# Patient Record
Sex: Female | Born: 2005
Health system: Southern US, Community
[De-identification: ages and names within clinical notes are randomized; demographics above are authoritative.]

## PROBLEM LIST (undated history)

## (undated) DIAGNOSIS — N281 Cyst of kidney, acquired: Secondary | ICD-10-CM

## (undated) DIAGNOSIS — R002 Palpitations: Secondary | ICD-10-CM

## (undated) DIAGNOSIS — G90A Postural orthostatic tachycardia syndrome (POTS): Secondary | ICD-10-CM

## (undated) DIAGNOSIS — B2790 Infectious mononucleosis, unspecified without complication: Secondary | ICD-10-CM

## (undated) DIAGNOSIS — M2550 Pain in unspecified joint: Secondary | ICD-10-CM

---
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
## 2023-01-10 ENCOUNTER — Other Ambulatory Visit: Payer: Self-pay

## 2023-01-10 ENCOUNTER — Ambulatory Visit
Admission: RE | Admit: 2023-01-10 | Discharge: 2023-01-10 | Disposition: A | Discharge status: Home / Self Care | Payer: BC Managed Care – PPO | Source: Ambulatory Visit | Attending: Family Medicine | Admitting: Family Medicine

## 2023-01-10 VITALS — BP 109/75 | HR 85 | Temp 98.9°F | Resp 16

## 2023-01-10 DIAGNOSIS — M791 Myalgia, unspecified site: Secondary | ICD-10-CM

## 2023-01-10 DIAGNOSIS — M6281 Muscle weakness (generalized): Secondary | ICD-10-CM

## 2023-01-10 DIAGNOSIS — Z8619 Personal history of other infectious and parasitic diseases: Secondary | ICD-10-CM

## 2023-01-10 NOTE — ED Provider Notes (Signed)
[name]    CSN: [id number] Arrival date & time: 01/10/23  1508      History   Chief Complaint Chief Complaint  Patient presents with   Extremity Pain    HPI Tanya Cunningham is a 17 y.o. female.   17 year old woman who is here with her mother for evaluation of extremity pain.  She states that Sunday she has been having pain in her legs, and then her arms.  She states that there are leg muscles "burning" as if she has been exercising for a long time.  In addition when she tries to stand and walk for any period of time her muscles tremble.  She feels generalized weakness in her arms and legs.  Nothing in her neck back or trunk.  No fever or chills.  No respiratory symptoms or cough. She recently had mono and is recovering from this.  Her lab work and ultrasound have normalized She does have an a complex medical history including dysautonomia, POTS, she sees pediatric urology at [facility] for a kidney cyst, she sees pediatric cardiology at [facility].  She has a pediatrician who was just discharged because of her age 20, and also goes to [facility]. Her only prescription medication is naltrexone (unknown indication)    Past Medical History:  Diagnosis Date   POTS (postural orthostatic tachycardia syndrome)     Patient Active Problem List   Diagnosis Date Noted   Bone cyst of foot 04/14/2018   Toe fracture, left 09/17/2016   Turf toe 04/23/2016   Contusion of left tibia 01/24/2016   Sever's disease 06/28/2015   Right wrist pain 05/30/2015   Patellofemoral stress syndrome of left knee 05/05/2015   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 No pertinent surgical history.  OB History   No obstetric history on file.      Home Medications    Prior to Admission medications   Medication Sig Start Date End Date Taking? Authorizing Provider  Naltrexone HCl, Pain,  1.5 MG CAPS Take by mouth.    [provider]    Family History History reviewed. No pertinent family history.  Social History Social History   Tobacco Use   Smoking status: Never   Smokeless tobacco: Never  Substance Use Topics   Alcohol use: No    Alcohol/week: 0.0 standard drinks of alcohol   Drug use: No     Allergies   Other   Review of Systems Review of Systems See HPI  Physical Exam Triage Vital Signs ED Triage Vitals  Encounter Vitals Group     BP 01/10/23 1517 109/75     Systolic BP Percentile --      Diastolic BP Percentile --      Pulse Rate 01/10/23 1517 85     Resp 01/10/23 1517 16     Temp 01/10/23 1517 98.9 F (37.2 C)     Temp src --      SpO2 01/10/23 1517 99 %     Weight --      Height --      Head Circumference --      Peak Flow --      Pain Score 01/10/23 1518 4     Pain Loc --      Pain Education --      Exclude from Growth Chart --    No data found.  Updated Vital Signs BP  109/75   Pulse 85   Temp 98.9 F (37.2 C)   Resp 16   SpO2 99%     Physical Exam Constitutional:      General: She is not in acute distress.    Appearance: She is well-developed and normal weight. She is not ill-appearing.  HENT:     Head: Normocephalic and atraumatic.     Right Ear: Tympanic membrane and ear canal normal.     Left Ear: Tympanic membrane and ear canal normal.     Nose: Nose normal. No congestion.     Mouth/Throat:     Mouth: Mucous membranes are moist.     Pharynx: No posterior oropharyngeal erythema.  Eyes:     Conjunctiva/sclera: Conjunctivae normal.     Pupils: Pupils are equal, round, and reactive to light.     Comments: Discs are flat  Neck:     Comments: Mild tenderness in the upper trapezius Cardiovascular:     Rate and Rhythm: Normal rate and regular rhythm.     Heart sounds: Normal heart sounds.  Pulmonary:     Effort: Pulmonary effort is normal. No respiratory distress.     Breath sounds: Normal breath  sounds.  Chest:     Chest wall: No tenderness.  Abdominal:     General: There is no distension.     Palpations: Abdomen is soft.     Comments: No hepatosplenomegaly  Musculoskeletal:        General: Tenderness present. Normal range of motion.     Cervical back: Normal range of motion and neck supple. Tenderness present.     Comments: Patient complains of tenderness in the muscles of her quadriceps and calves.  With straight leg raise patient's quadricep muscles tremble visibly.    Skin:    General: Skin is warm and dry.     Findings: No rash.  Neurological:     General: No focal deficit present.     Mental Status: She is alert. Mental status is at baseline.      UC Treatments / Results  Labs (all labs ordered are listed, but only abnormal results are displayed) Labs Reviewed  COMPREHENSIVE METABOLIC PANEL  TSH  CBC WITH DIFFERENTIAL/PLATELET  CK  VITAMIN D 25 HYDROXY (VIT D DEFICIENCY, FRACTURES)    EKG   Radiology No results found.  Procedures Procedures (including critical care time)  Medications Ordered in UC Medications - No data to display  Initial Impression / Assessment and Plan / UC Course  I have reviewed the triage vital signs and the nursing notes.  Pertinent labs & imaging results that were available during my care of the patient were reviewed by me and considered in my medical decision making (see chart for details).     Patient has unusual symptoms of muscle pain and weakness.  I explained to the mother that with her complicated medical history, recent viral illness, and the limitations of an urgent care center that can only offer her some beginnings of workup.  We discussed that she could have vitamin D deficiency.  Could be thyroid.  I will check a CPK, CBC, and comprehensive metabolic panel.  She needs to follow-up with a doctor who knows her history and has her old records (I do not) Final Clinical Impressions(s) / UC Diagnoses   Final  diagnoses:  Myalgia  Muscle weakness  History of mononucleosis     Discharge Instructions      Stay well-hydrated Labs available tomorrow on MyChart  Call your usual provider tomorrow and follow-up (holistic)     ED Prescriptions   None    PDMP not reviewed this encounter.   Eustace Moore, MD 01/10/23 [phone]

## 2023-01-10 NOTE — ED Triage Notes (Addendum)
Since Sunday has had arm and leg weakness and burning. No fevers. Had mono in July/August, has been ill with that until recently.

## 2023-01-10 NOTE — Discharge Instructions (Signed)
Stay well-hydrated Labs available tomorrow on MyChart Call your usual provider tomorrow and follow-up (holistic)

## 2023-01-11 LAB — CBC WITH DIFFERENTIAL/PLATELET
Basophils Absolute: 0 10*3/uL (ref 0.0–0.2)
Basos: 1 %
EOS (ABSOLUTE): 0.1 10*3/uL (ref 0.0–0.4)
Eos: 1 %
Hematocrit: 44.3 % (ref 34.0–46.6)
Hemoglobin: 14.9 g/dL (ref 11.1–15.9)
Immature Grans (Abs): 0 10*3/uL (ref 0.0–0.1)
Immature Granulocytes: 0 %
Lymphocytes Absolute: 2.2 10*3/uL (ref 0.7–3.1)
Lymphs: 30 %
MCH: 32.3 pg (ref 26.6–33.0)
MCHC: 33.6 g/dL (ref 31.5–35.7)
MCV: 96 fL (ref 79–97)
Monocytes Absolute: 0.4 10*3/uL (ref 0.1–0.9)
Monocytes: 6 %
Neutrophils Absolute: 4.5 10*3/uL (ref 1.4–7.0)
Neutrophils: 62 %
Platelets: 256 10*3/uL (ref 150–450)
RBC: 4.61 x10E6/uL (ref 3.77–5.28)
RDW: 12.3 % (ref 11.7–15.4)
WBC: 7.1 10*3/uL (ref 3.4–10.8)

## 2023-01-11 LAB — COMPREHENSIVE METABOLIC PANEL
ALT: 13 [IU]/L (ref 0–32)
AST: 16 [IU]/L (ref 0–40)
Albumin: 4.8 g/dL (ref 4.0–5.0)
Alkaline Phosphatase: 78 [IU]/L (ref 42–106)
BUN/Creatinine Ratio: 22 (ref 9–23)
BUN: 14 mg/dL (ref 6–20)
Bilirubin Total: 0.5 mg/dL (ref 0.0–1.2)
CO2: 18 mmol/L — ABNORMAL LOW (ref 20–29)
Calcium: 9.6 mg/dL (ref 8.7–10.2)
Chloride: 103 mmol/L (ref 96–106)
Creatinine, Ser: 0.64 mg/dL (ref 0.57–1.00)
Globulin, Total: 2.2 g/dL (ref 1.5–4.5)
Glucose: 98 mg/dL (ref 70–99)
Potassium: 3.9 mmol/L (ref 3.5–5.2)
Sodium: 139 mmol/L (ref 134–144)
Total Protein: 7 g/dL (ref 6.0–8.5)
eGFR: 131 mL/min/{1.73_m2} (ref >59)

## 2023-01-11 LAB — VITAMIN D 25 HYDROXY (VIT D DEFICIENCY, FRACTURES): Vit D, 25-Hydroxy: 81.2 ng/mL (ref 30.0–100.0)

## 2023-01-11 LAB — TSH: TSH: 1.12 u[IU]/mL (ref 0.450–4.500)

## 2023-01-11 LAB — CK: Total CK: 43 U/L (ref 32–182)

## 2023-01-21 ENCOUNTER — Telehealth: Payer: Self-pay

## 2023-03-07 ENCOUNTER — Encounter: Payer: Self-pay | Admitting: Internal Medicine

## 2023-03-07 ENCOUNTER — Ambulatory Visit: Payer: BC Managed Care – PPO | Attending: Internal Medicine | Admitting: Internal Medicine

## 2023-03-07 VITALS — BP 102/71 | HR 106 | Ht 66.0 in | Wt 114.0 lb

## 2023-03-07 DIAGNOSIS — R002 Palpitations: Secondary | ICD-10-CM | POA: Diagnosis not present

## 2023-03-07 DIAGNOSIS — G90A Postural orthostatic tachycardia syndrome (POTS): Secondary | ICD-10-CM | POA: Diagnosis not present

## 2023-03-07 DIAGNOSIS — R55 Syncope and collapse: Secondary | ICD-10-CM

## 2023-03-07 DIAGNOSIS — R9431 Abnormal electrocardiogram [ECG] [EKG]: Secondary | ICD-10-CM | POA: Diagnosis not present

## 2023-03-07 NOTE — Patient Instructions (Signed)
 Medication Instructions:  Your physician recommends that you continue on your current medications as directed. Please refer to the Current Medication list given to you today.  *If you need a refill on your cardiac medications before your next appointment, please call your pharmacy*   Lab Work: None ordered.  If you have labs (blood work) drawn today and your tests are completely normal, you will receive your results only by: MyChart Message (if you have MyChart) OR A paper copy in the mail If you have any lab test that is abnormal or we need to change your treatment, we will call you to review the results.   Testing/Procedures: Your physician has requested that you have a cardiac MRI. Cardiac MRI uses a computer to create images of your heart as its beating, producing both still and moving pictures of your heart and major blood vessels. For further information please visit [email or web address]. Please follow the instruction sheet given to you today for more information.     Follow-Up: At [facility], you and your health needs are our priority.  As part of our continuing mission to provide you with exceptional heart care, we have created designated Provider Care Teams.  These Care Teams include your primary Cardiologist (physician) and Advanced Practice Providers (APPs -  Physician Assistants and Nurse Practitioners) who all work together to provide you with the care you need, when you need it.  We recommend signing up for the patient portal called MyChart.  Sign up information is provided on this After Visit Summary.  MyChart is used to connect with patients for Virtual Visits (Telemedicine).  Patients are able to view lab/test results, encounter notes, upcoming appointments, etc.  Non-urgent messages can be sent to your provider as well.   To learn more about what you can do with MyChart, go to [email or web address].    Your next appointment:   4 months with Dr Fernande

## 2023-03-07 NOTE — Progress Notes (Signed)
 ELECTROPHYSIOLOGY CONSULT NOTE  Patient ID: Tanya Cunningham, MRN: [id number], DOB/AGE: 04/29/2005 18 y.o. Admit date: (Not on file) Date of Consult: 03/07/2023  Primary Physician: Tressa Nat PARAS, MD Primary Cardiologist: PNA     Tanya Cunningham is a 18 y.o. female who is being seen today for the evaluation of palpitatons at the request of PNa.    HPI Tanya Cunningham is a 18 y.o. female with a 2-year history of spells.  With a history of an antecedent viral infection, developed lightheadedness and syncope 12/22.  Followed shortly thereafter with exercise intolerance accompanied by tachypalpitations and dyspnea; fatigue   Shower intolerance (Sx worsened recently following an interval Epstein-Barr virus infection)  now with violaceous discoloration during showers Heat intolerance Menses intolerance  When she had her syncopal event, she ultimately was seen by [facility] cardiology where her abnormal electrocardiogram was noted.  An echocardiogram in care everywhere is normal.  The ECG remained abnormal with inferolateral ST segment depression and T wave inversions.   History of hypokalemia couple years ago ultimately attributed to diarrhea but is seeing renal at [facility]  DATE TEST EF   2/23 (CE)  Echo   55-65 %         Date Cr K Hgb  11/24 0.64 3..9<<3.0 14.9            Past Medical History:  Diagnosis Date   POTS (postural orthostatic tachycardia syndrome)       Surgical History: No past surgical history on file.   Home Meds: Current Meds  Medication Sig   Naltrexone HCl, Pain, 1.5 MG CAPS Take by mouth.    Allergies:  Allergies  Allergen Reactions   Other Rash    Honey and crystal lite drink    Social History   Socioeconomic History   Marital status: Single    Spouse name: Not on file   Number of children: Not on file   Years of education: Not on file   Highest education level: Not on file  Occupational History   Not on file  Tobacco  Use   Smoking status: Never   Smokeless tobacco: Never  Substance and Sexual Activity   Alcohol use: No    Alcohol/week: 0.0 standard drinks of alcohol   Drug use: No   Sexual activity: Not on file  Other Topics Concern   Not on file  Social History Narrative   Not on file   Social Drivers of Health   Financial Resource Strain: Not on file  Food Insecurity: Not on file  Transportation Needs: Not on file  Physical Activity: Not on file  Stress: Not on file  Social Connections: Unknown (07/11/2021)   Received from [facility]   Social Network    Social Network: Not on file  Intimate Partner Violence: Unknown (06/02/2021)   Received from [facility]   HITS    Physically Hurt: Not on file    Insult or Talk Down To: Not on file    Threaten Physical Harm: Not on file    Scream or Curse: Not on file     No family history on file.   ROS:  Please see the history of present illness.     All other systems reviewed and negative.    Physical Exam: Blood pressure 102/71, pulse (!) 106, height 5' 6 (1.676 m), weight 114 lb (51.7 kg), SpO2 98%. General: Well developed, well nourished female in no acute  distress. Head: Normocephalic, atraumatic, sclera non-icteric, no xanthomas, nares are without discharge. EENT: normal  Lymph Nodes:  none Neck: Negative for carotid bruits. JVD not elevated. Back:without scoliosis kyphosis Lungs: Clear bilaterally to auscultation without wheezes, rales, or rhonchi. Breathing is unlabored. Heart: RRR with S1 S2. No  murmur . No rubs, or gallops appreciated. Abdomen: Soft, non-tender, non-distended with normoactive bowel sounds. No hepatomegaly. No rebound/guarding. No obvious abdominal masses. Msk:  Strength and tone appear normal for age. Extremities: No clubbing or cyanosis. No edema.  Distal pedal pulses are 2+ and equal bilaterally. arachnodactlyly Skin: Warm and Dry Neuro: Alert and oriented X 3. CN III-XII  intact Grossly normal sensory and motor function . Psych:  Responds to questions appropriately with a normal affect.        EKG: Sinus at 92 Intervals 15/08/32 Nonspecific ST-T changes Unchanged from 2023  Assessment and Plan:   Syncope and other symptoms consistent with dysautonomia  Abnormal electrocardiogram  Hypokalemia-intermittent  The patient has the onset of postviral orthostatic and exercise intolerance characterized by tachypalpitations and lightheadedness most consistent with dysautonomia.  She did not with [facility] and does not today meet criteria for POTS although her salt intake is exemplary and this may mitigate some of her findings.  We discussed extensively the issues of dysautonomia, the physiology of orthstasis and positional stress.  We discussed the role of salt and water repletion, the importance of exercise, often needing to be started in the recumbent position, and the awareness of triggers and the role of ambient heat and dehydration  Discussed salt substitutes with a  goal of 3-4 gms of Sodium or 12-15 gm of sodium Chloride  daily.  Rehydration solutions include Liquid IV, NUUN, TriOral, Normralyte pedialyte advanced care and Banana Bags.  Salt tablets include plain salt tablets, SaltStick Vitassium, thermatabs amongst others.   The higher sodium concentration per serving on this list is Within You hydration formula also at 1000 mg, normalyte about  800 mg of sodium per serving LMNT1000 mg and most recently about  Salt supplements are best used with adjunctive sugar  Also discussed the role of compression wear including thigh sleeves and abdominal binders.  Calf compression is not specifically recommended..  We also discussed raising the head of the bed and the importance of exercise and gave her the [facility] contact information  Discussed the challenges that she has had of having to forego going to college because of a crummy she feels   Elspeth Sage

## 2023-04-11 NOTE — Progress Notes (Deleted)
  [name] [address] [place] [address] Phone: [phone] Subjective:    I'm seeing this patient by the request  of:  Mikey College, MD  CC:   [id number]  Tanya Cunningham is a 18 y.o. female coming in with complaint of L foot pain. Patient states        Past Medical History:  Diagnosis Date   POTS (postural orthostatic tachycardia syndrome)    No past surgical history on file. Social History   Socioeconomic History   Marital status: Single    Spouse name: Not on file   Number of children: Not on file   Years of education: Not on file   Highest education level: Not on file  Occupational History   Not on file  Tobacco Use   Smoking status: Never   Smokeless tobacco: Never  Substance and Sexual Activity   Alcohol use: No    Alcohol/week: 0.0 standard drinks of alcohol   Drug use: No   Sexual activity: Not on file  Other Topics Concern   Not on file  Social History Narrative   Not on file   Social Drivers of Health   Financial Resource Strain: Not on file  Food Insecurity: Not on file  Transportation Needs: Not on file  Physical Activity: Not on file  Stress: Not on file  Social Connections: Unknown (07/11/2021)   Received from [facility], [facility]   Social Network    Social Network: Not on file   Allergies  Allergen Reactions   Other Rash    Honey and crystal lite drink   No family history on file.     Current Outpatient Medications (Analgesics):    Naltrexone HCl, Pain, 1.5 MG CAPS, Take by mouth.     Reviewed prior external information including notes and imaging from  primary care provider As well as notes that were available from care everywhere and other healthcare systems.  Past medical history, social, surgical and family history all reviewed in electronic medical record.  No pertanent information unless stated regarding to the chief complaint.   Review of Systems:  No  headache, visual changes, nausea, vomiting, diarrhea, constipation, dizziness, abdominal pain, skin rash, fevers, chills, night sweats, weight loss, swollen lymph nodes, body aches, joint swelling, chest pain, shortness of breath, mood changes. POSITIVE muscle aches  Objective  There were no vitals taken for this visit.   General: No apparent distress alert and oriented x3 mood and affect normal, dressed appropriately.  HEENT: Pupils equal, extraocular movements intact  Respiratory: Patient's speak in full sentences and does not appear short of breath  Cardiovascular: No lower extremity edema, non tender, no erythema      Impression and Recommendations:

## 2023-04-16 ENCOUNTER — Ambulatory Visit: Payer: BC Managed Care – PPO | Admitting: Family Medicine

## 2023-04-16 ENCOUNTER — Encounter: Payer: Self-pay | Admitting: Internal Medicine

## 2023-04-17 ENCOUNTER — Other Ambulatory Visit: Payer: Self-pay

## 2023-04-17 ENCOUNTER — Ambulatory Visit (INDEPENDENT_AMBULATORY_CARE_PROVIDER_SITE_OTHER): Payer: BC Managed Care – PPO | Admitting: Family Medicine

## 2023-04-17 VITALS — BP 104/70 | HR 80 | Ht 66.0 in | Wt 115.0 lb

## 2023-04-17 DIAGNOSIS — M85679 Other cyst of bone, unspecified ankle and foot: Secondary | ICD-10-CM

## 2023-04-17 MED ORDER — PREDNISONE 20 MG PO TABS: 40.0000 mg | ORAL_TABLET | Freq: Every day | ORAL | 0 refills | Status: DC

## 2023-04-17 NOTE — Assessment & Plan Note (Addendum)
Patient seen in the holding did not get a MRI of the foot previously again to further evaluate.  On ultrasound today seems to have some more of a bursitis and she will need to be admitted type of bony abnormality.  Could get x-rays today do not feel like it is likely necessary.  Short course of prednisone given.  Do believe that the regimen relatively well.  Discussed icing regimen and home exercises.  Discussed which activities to do and which ones to avoid.  Follow-up again in 6 to 8 weeks.

## 2023-04-17 NOTE — Patient Instructions (Signed)
Avoid being barefoot Rigid sole shoes when possible Prednisone 40mg  5 days In 2 weeks send update may consider labs See you again in 4-5 weeks

## 2023-04-17 NOTE — Progress Notes (Signed)
[name] [address] [place] [address] Phone: [phone] Subjective:   INadine Counts, am serving as a scribe for Dr. Antoine Primas.  I'm seeing this patient by the request  of:  Tanya College, MD  CC: Foot pain and swelling  [id number]  Tanya Cunningham is a 18 y.o. female coming in with complaint of foot pain.  Reviewing patient's chart over the last year has been seen in the emergency room multiple times for mono, costochondritis as well as myalgias.  Been diagnosed with POTS by cardiology. Started bothering her recently.    Did have imaging of the foot where there was a cystic area in the medial aspect of the first metatarsal head that seem to be a potential for an interosseous ganglion.  This is seen on MRI in 2020  Past Medical History:  Diagnosis Date   POTS (postural orthostatic tachycardia syndrome)    No past surgical history on file. Social History   Socioeconomic History   Marital status: Single    Spouse name: Not on file   Number of children: Not on file   Years of education: Not on file   Highest education level: Not on file  Occupational History   Not on file  Tobacco Use   Smoking status: Never   Smokeless tobacco: Never  Substance and Sexual Activity   Alcohol use: No    Alcohol/week: 0.0 standard drinks of alcohol   Drug use: No   Sexual activity: Not on file  Other Topics Concern   Not on file  Social History Narrative   Not on file   Social Drivers of Health   Financial Resource Strain: Not on file  Food Insecurity: Not on file  Transportation Needs: Not on file  Physical Activity: Not on file  Stress: Not on file  Social Connections: Unknown (07/11/2021)   Received from [facility], [facility]   Social Network    Social Network: Not on file   Allergies  Allergen Reactions   Other Rash    Honey and crystal lite drink   No family history on file.  Current Outpatient  Medications (Endocrine & Metabolic):    predniSONE (DELTASONE) 20 MG tablet, Take 2 tablets (40 mg total) by mouth daily with breakfast.    Current Outpatient Medications (Analgesics):    Naltrexone HCl, Pain, 1.5 MG CAPS, Take by mouth.     Reviewed prior external information including notes and imaging from  primary care provider As well as notes that were available from care everywhere and other healthcare systems.  Past medical history, social, surgical and family history all reviewed in electronic medical record.  No pertanent information unless stated regarding to the chief complaint.   Review of Systems:  No headache, visual changes, nausea, vomiting, diarrhea, constipation, dizziness, abdominal pain, skin rash, fevers, chills, night sweats, weight loss, swollen lymph nodes, body aches, joint swelling, chest pain, shortness of breath, mood changes. POSITIVE muscle aches, joint swelling  Objective  Blood pressure 104/70, pulse 80, height 5\' 6"  (1.676 m), weight 115 lb (52.2 kg), SpO2 97%.   General: No apparent distress alert and oriented x3 mood and affect normal, dressed appropriately.  HEENT: Pupils equal, extraocular movements intact  Respiratory: Patient's speak in full sentences and does not appear short of breath  Cardiovascula rectal exam does have some rigidity noted on the CBC joint.  The patient is tender though more on the plantar aspect.  Limited muscular  skeletal ultrasound was performed and interpreted by Antoine Primas, M   Limited ultrasound shows no significant swelling noted of the [facility] joint itself.  Patient does have hypoechoic changes that does seem to be consistent with more of the aspect of the foot.  Tendons appear to be okay.   Impression and Recommendations:     The above documentation has been reviewed and is accurate and complete Judi Saa, DO

## 2023-05-02 ENCOUNTER — Encounter (HOSPITAL_COMMUNITY): Payer: Self-pay

## 2023-05-03 ENCOUNTER — Telehealth: Payer: Self-pay | Admitting: Family Medicine

## 2023-05-03 NOTE — Telephone Encounter (Signed)
 Sent patient MyChart message.

## 2023-05-03 NOTE — Telephone Encounter (Signed)
 Patients mother Eunice Blase) called and says Boni was just here and Dr. Katrinka Blazing prescribed prednisone. She got sick before she could take it and was sick for a week and did not take it and then they left town and forgot the medication. They are currently in [place] and she was going to take it when she got back but her arms are burning and that comes and goes. Patients mother asked if Dr.Smith could call the prescription into [facility] in [place] to take there and not take the one already sent when they get back to [place]. Patient's mother states she is having problems lifting arms. She has a a cardiac MRI Monday and Patient's mother is asking if she can she be taking that medication while having that MRI done?   Patient's mother states that a message in [place] to [place] is fine or a call to [place] or her mother.   She provided the phone number to a [facility] pharmacy near them in [place] [phone].

## 2023-05-06 ENCOUNTER — Ambulatory Visit (HOSPITAL_COMMUNITY): Admission: RE | Admit: 2023-05-06 | Payer: BC Managed Care – PPO | Source: Ambulatory Visit

## 2023-05-07 ENCOUNTER — Encounter: Payer: Self-pay | Admitting: Family Medicine

## 2023-05-08 NOTE — Progress Notes (Unsigned)
 [name] [address] [place] [address] Phone: [phone] Subjective:   Tanya Cunningham, am serving as a scribe for Dr. Antoine Primas.  I'm seeing this patient by the request  of:  Mikey College, MD  CC: Left foot pain, pain all over.  [id number]  04/17/2023 Patient seen in the holding did not get a MRI of the foot previously again to further evaluate.  On ultrasound today seems to have some more of a bursitis and she will need to be admitted type of bony abnormality.  Could get x-rays today do not feel like it is likely necessary.  Short course of prednisone given.  Do believe that the regimen relatively well.  Discussed icing regimen and home exercises.  Discussed which activities to do and which ones to avoid.  Follow-up again in 6 to 8 weeks.      Update 05/09/2023 Tanya Cunningham is a 18 y.o. female coming in with complaint of L foot pain. States that she is walking weird on her foot but that she never really has had pain.  Patient states that it is more of leg cramping.  Unfortunately starting to seem to go up to the upper extremities as well.  Sometimes associated with headaches.  Has been receiving some hormone replacements from an integrative therapy as well as getting infusions.  Patient states that she has been having muscular pain throughout the entire body. Twitching in quad for past few days.       Past Medical History:  Diagnosis Date   POTS (postural orthostatic tachycardia syndrome)    No past surgical history on file. Social History   Socioeconomic History   Marital status: Single    Spouse name: Not on file   Number of children: Not on file   Years of education: Not on file   Highest education level: Not on file  Occupational History   Not on file  Tobacco Use   Smoking status: Never   Smokeless tobacco: Never  Substance and Sexual Activity   Alcohol use: No    Alcohol/week: 0.0 standard drinks of  alcohol   Drug use: No   Sexual activity: Not on file  Other Topics Concern   Not on file  Social History Narrative   Not on file   Social Drivers of Health   Financial Resource Strain: Not on file  Food Insecurity: Not on file  Transportation Needs: Not on file  Physical Activity: Not on file  Stress: Not on file  Social Connections: Unknown (07/11/2021)   Received from [facility], [facility]   Social Network    Social Network: Not on file   Allergies  Allergen Reactions   Other Rash    Honey and crystal lite drink   No family history on file.  Current Outpatient Medications (Endocrine & Metabolic):    predniSONE (DELTASONE) 20 MG tablet, Take 2 tablets (40 mg total) by mouth daily with breakfast.    Current Outpatient Medications (Analgesics):    Naltrexone HCl, Pain, 1.5 MG CAPS, Take by mouth.     Reviewed prior external information including notes and imaging from  primary care provider As well as notes that were available from care everywhere and other healthcare systems.  Past medical history, social, surgical and family history all reviewed in electronic medical record.  No pertanent information unless stated regarding to the chief complaint.   Review of Systems:  No  visual changes, nausea, vomiting,  diarrhea, constipation,  abdominal pain, skin rash, fevers, chills, night sweats, weight loss, swollen lymph nodes, joint swelling, chest pain, shortness of breath, mood changes. POSITIVE muscle aches body aches, headaches and dizziness  Objective  Blood pressure 100/62, pulse (!) 115, height 5\' 6"  (1.676 m), weight 114 lb (51.7 kg), SpO2 95%.   General: No apparent distress alert and oriented x3 mood and affect normal, dressed appropriately.  HEENT: Pupils equal, extraocular movements intact  Respiratory: Patient's speak in full sentences and does not appear short of breath  Cardiovascular: No lower extremity edema, non tender, no erythema  Patient has  some fasciculations with repetitive activities.  Patient does have some mild atrophy noted.  Seems to be of the lower extremities.  Neurovascularly though intact.    Impression and Recommendations:    The above documentation has been reviewed and is accurate and complete Judi Saa, DO

## 2023-05-09 ENCOUNTER — Ambulatory Visit (INDEPENDENT_AMBULATORY_CARE_PROVIDER_SITE_OTHER): Admitting: Family Medicine

## 2023-05-09 ENCOUNTER — Encounter: Payer: Self-pay | Admitting: Family Medicine

## 2023-05-09 ENCOUNTER — Other Ambulatory Visit: Payer: Self-pay

## 2023-05-09 VITALS — BP 100/62 | HR 115 | Ht 66.0 in | Wt 114.0 lb

## 2023-05-09 DIAGNOSIS — M2550 Pain in unspecified joint: Secondary | ICD-10-CM | POA: Diagnosis not present

## 2023-05-09 DIAGNOSIS — R519 Headache, unspecified: Secondary | ICD-10-CM

## 2023-05-09 DIAGNOSIS — M79672 Pain in left foot: Secondary | ICD-10-CM | POA: Diagnosis not present

## 2023-05-09 NOTE — Assessment & Plan Note (Signed)
 Significant polyarthralgia noted.  Discussed icing regimen and home exercises, discussed which activities to do and which ones to avoid.  Increase activity slowly.  Do feel at this point with patient having pain for such a great amount of time we should consider the further evaluation.  Increase activity slowly otherwise.  Follow-up with me again in 6 to 8 weeks.  Would like to see what the laboratory workup which showed.  I do think we do also need to rule out such things as demyelinating diseases and we will get an MRI of the head and neck to further evaluate for anything like that.  Rule out autoimmune with labs.  Will follow-up after workup.

## 2023-05-09 NOTE — Patient Instructions (Addendum)
 Labs today MRI Brain and neck wo Kathryne Sharper  See me in 2 months but we will be in touch

## 2023-05-13 ENCOUNTER — Other Ambulatory Visit (INDEPENDENT_AMBULATORY_CARE_PROVIDER_SITE_OTHER)

## 2023-05-13 ENCOUNTER — Encounter: Payer: Self-pay | Admitting: Family Medicine

## 2023-05-13 DIAGNOSIS — M2550 Pain in unspecified joint: Secondary | ICD-10-CM

## 2023-05-13 LAB — CBC WITH DIFFERENTIAL/PLATELET
Basophils Absolute: 0 10*3/uL (ref 0.0–0.1)
Basophils Relative: 0.7 % (ref 0.0–3.0)
Eosinophils Absolute: 0.1 10*3/uL (ref 0.0–0.7)
Eosinophils Relative: 1.8 % (ref 0.0–5.0)
HCT: 43.1 % (ref 36.0–49.0)
Hemoglobin: 15 g/dL (ref 12.0–16.0)
Lymphocytes Relative: 38.1 % (ref 24.0–48.0)
Lymphs Abs: 2.5 10*3/uL (ref 0.7–4.0)
MCHC: 34.8 g/dL (ref 31.0–37.0)
MCV: 92.5 fl (ref 78.0–98.0)
Monocytes Absolute: 0.5 10*3/uL (ref 0.1–1.0)
Monocytes Relative: 8.1 % (ref 3.0–12.0)
Neutro Abs: 3.4 10*3/uL (ref 1.4–7.7)
Neutrophils Relative %: 51.3 % (ref 43.0–71.0)
Platelets: 280 10*3/uL (ref 150.0–575.0)
RBC: 4.66 Mil/uL (ref 3.80–5.70)
RDW: 13 % (ref 11.4–15.5)
WBC: 6.6 10*3/uL (ref 4.5–13.5)

## 2023-05-13 LAB — COMPREHENSIVE METABOLIC PANEL
ALT: 17 U/L (ref 0–35)
AST: 15 U/L (ref 0–37)
Albumin: 4.9 g/dL (ref 3.5–5.2)
Alkaline Phosphatase: 74 U/L (ref 47–119)
BUN: 12 mg/dL (ref 6–23)
CO2: 27 meq/L (ref 19–32)
Calcium: 9.4 mg/dL (ref 8.4–10.5)
Chloride: 103 meq/L (ref 96–112)
Creatinine, Ser: 0.54 mg/dL (ref 0.40–1.20)
GFR: 133.64 mL/min (ref >60.00)
Glucose, Bld: 106 mg/dL — ABNORMAL HIGH (ref 70–99)
Potassium: 3.6 meq/L (ref 3.5–5.1)
Sodium: 138 meq/L (ref 135–145)
Total Bilirubin: 0.4 mg/dL (ref 0.2–1.2)
Total Protein: 7.3 g/dL (ref 6.0–8.3)

## 2023-05-13 LAB — URINALYSIS
Bilirubin Urine: NEGATIVE
Hgb urine dipstick: NEGATIVE
Ketones, ur: NEGATIVE
Leukocytes,Ua: NEGATIVE
Nitrite: NEGATIVE
Specific Gravity, Urine: <=1.005 — AB (ref 1.000–1.030)
Total Protein, Urine: NEGATIVE
Urine Glucose: NEGATIVE
Urobilinogen, UA: 0.2 (ref 0.0–1.0)
pH: 7 (ref 5.0–8.0)

## 2023-05-13 LAB — TSH: TSH: 0.86 u[IU]/mL (ref 0.40–5.00)

## 2023-05-13 LAB — URIC ACID: Uric Acid, Serum: 4 mg/dL (ref 2.4–7.0)

## 2023-05-13 LAB — IBC PANEL
Iron: 82 ug/dL (ref 42–145)
Saturation Ratios: 24.8 % (ref 20.0–50.0)
TIBC: 330.4 ug/dL (ref 250.0–450.0)
Transferrin: 236 mg/dL (ref 212.0–360.0)

## 2023-05-13 LAB — VITAMIN D 25 HYDROXY (VIT D DEFICIENCY, FRACTURES): VITD: 62.74 ng/mL (ref 30.00–100.00)

## 2023-05-13 LAB — TESTOSTERONE: Testosterone: 76.95 ng/dL — ABNORMAL HIGH (ref 15.00–40.00)

## 2023-05-13 LAB — FERRITIN: Ferritin: 39.4 ng/mL (ref 10.0–291.0)

## 2023-05-13 LAB — SEDIMENTATION RATE: Sed Rate: 3 mm/h (ref 0–20)

## 2023-05-13 LAB — C-REACTIVE PROTEIN: CRP: <1 mg/dL (ref 0.5–20.0)

## 2023-05-13 LAB — VITAMIN B12: Vitamin B-12: 704 pg/mL (ref 211–911)

## 2023-05-13 LAB — HCG, QUANTITATIVE, PREGNANCY: Quantitative HCG: 0.77 m[IU]/mL

## 2023-05-13 NOTE — Addendum Note (Signed)
 Addended by: Evon Slack on: 05/13/2023 04:07 PM   Modules accepted: Orders

## 2023-05-13 NOTE — Addendum Note (Signed)
 Addended by: Evon Slack on: 05/13/2023 04:18 PM   Modules accepted: Orders

## 2023-05-14 ENCOUNTER — Encounter: Payer: Self-pay | Admitting: Family Medicine

## 2023-05-14 ENCOUNTER — Other Ambulatory Visit: Payer: Self-pay

## 2023-05-14 DIAGNOSIS — M2550 Pain in unspecified joint: Secondary | ICD-10-CM

## 2023-05-15 ENCOUNTER — Ambulatory Visit: Payer: BC Managed Care – PPO | Admitting: Family Medicine

## 2023-05-15 LAB — GC/CHLAMYDIA PROBE AMP
Chlamydia trachomatis, NAA: NEGATIVE
Neisseria Gonorrhoeae by PCR: NEGATIVE

## 2023-05-18 ENCOUNTER — Other Ambulatory Visit

## 2023-05-19 ENCOUNTER — Ambulatory Visit

## 2023-05-19 DIAGNOSIS — R519 Headache, unspecified: Secondary | ICD-10-CM | POA: Diagnosis not present

## 2023-05-19 DIAGNOSIS — G90A Postural orthostatic tachycardia syndrome (POTS): Secondary | ICD-10-CM

## 2023-05-23 ENCOUNTER — Encounter: Payer: Self-pay | Admitting: Family Medicine

## 2023-05-23 LAB — PTH, INTACT AND CALCIUM
Calcium: 9.3 mg/dL (ref 8.9–10.4)
PTH: 21 pg/mL (ref 16–77)

## 2023-05-23 LAB — CALCIUM, IONIZED: Calcium, Ion: 4.9 mg/dL (ref 4.7–5.5)

## 2023-05-23 LAB — PORPHYRINS, FRACTIONATED, RANDOM URINE
COPROPORPHYRIN I: 38.6 ug/g{creat} — ABNORMAL HIGH (ref 6.5–33.2)
Coproporphyrin III (PORFRU): 110.9 ug/g{creat} — ABNORMAL HIGH (ref 4.8–88.6)
TOTAL PORPHYRINS: 161.3 ug/g{creat} — ABNORMAL HIGH (ref 27.0–153.6)
Uroporphyrin I: 8.4 ug/g{creat} (ref 3.6–21.1)
Uroporphyrin III (PORFRU): 3.4 ug/g{creat} (ref <=5.6)

## 2023-05-23 LAB — RHEUMATOID FACTOR: Rheumatoid fact SerPl-aCnc: <10 [IU]/mL (ref <14)

## 2023-05-23 LAB — CMV ABS, IGG+IGM (CYTOMEGALOVIRUS)
CMV IgM: <30 [AU]/ml
Cytomegalovirus Ab-IgG: >10 U/mL — ABNORMAL HIGH

## 2023-05-23 LAB — EPSTEIN-BARR VIRUS NUCLEAR ANTIGEN ANTIBODY, IGG: EBV NA IgG: 341 U/mL — ABNORMAL HIGH

## 2023-05-23 LAB — HSV 2 ANTIBODY, IGG: HSV 2 Glycoprotein G Ab, IgG: <0.9 {index}

## 2023-05-23 LAB — CYCLIC CITRUL PEPTIDE ANTIBODY, IGG: Cyclic Citrullin Peptide Ab: <16 U

## 2023-05-23 LAB — ANA: Anti Nuclear Antibody (ANA): NEGATIVE

## 2023-05-23 LAB — RPR: RPR Ser Ql: NONREACTIVE

## 2023-05-23 LAB — ESTROGENS, TOTAL: Estrogen: 94 pg/mL

## 2023-05-23 LAB — ANGIOTENSIN CONVERTING ENZYME: Angiotensin-Converting Enzyme: 33 U/L (ref 9–67)

## 2023-05-24 NOTE — Telephone Encounter (Signed)
 I called and spoke with [facility].  Her porphyria labs were elevated.  Dr. Katrinka Blazing looks like he was considering a hematology referral.  I put that referral in myself today.  I reassured her that her liver enzymes on this check look normal and that she does have evidence of having had mono in the past but it does not look active now.

## 2023-05-24 NOTE — Telephone Encounter (Signed)
 Forwarding to Dr. Denyse Amass to review and advise.

## 2023-05-28 ENCOUNTER — Encounter (HOSPITAL_COMMUNITY): Payer: Self-pay

## 2023-05-29 ENCOUNTER — Other Ambulatory Visit: Payer: Self-pay | Admitting: Internal Medicine

## 2023-05-29 ENCOUNTER — Ambulatory Visit (HOSPITAL_COMMUNITY)
Admission: RE | Admit: 2023-05-29 | Discharge: 2023-05-29 | Disposition: A | Discharge status: Home / Self Care | Source: Ambulatory Visit | Attending: Internal Medicine | Admitting: Internal Medicine

## 2023-05-29 DIAGNOSIS — G90A Postural orthostatic tachycardia syndrome (POTS): Secondary | ICD-10-CM | POA: Diagnosis present

## 2023-05-29 DIAGNOSIS — R55 Syncope and collapse: Secondary | ICD-10-CM

## 2023-05-29 DIAGNOSIS — R002 Palpitations: Secondary | ICD-10-CM | POA: Diagnosis present

## 2023-05-29 DIAGNOSIS — R9431 Abnormal electrocardiogram [ECG] [EKG]: Secondary | ICD-10-CM

## 2023-05-29 MED ORDER — GADOBUTROL 1 MMOL/ML IV SOLN
5.0000 mL | Freq: Once | INTRAVENOUS | Status: AC | PRN
  Administered 2023-05-29: 5 mL via INTRAVENOUS

## 2023-06-03 ENCOUNTER — Encounter: Payer: Self-pay | Admitting: Family Medicine

## 2023-06-04 ENCOUNTER — Encounter: Payer: Self-pay | Admitting: Family Medicine

## 2023-06-19 ENCOUNTER — Inpatient Hospital Stay

## 2023-06-19 ENCOUNTER — Inpatient Hospital Stay: Attending: Hematology and Oncology | Admitting: Hematology and Oncology

## 2023-06-19 VITALS — BP 88/78 | HR 83 | Temp 97.7°F | Resp 15 | Wt 116.0 lb

## 2023-06-19 DIAGNOSIS — R8290 Unspecified abnormal findings in urine: Secondary | ICD-10-CM

## 2023-06-19 DIAGNOSIS — G90A Postural orthostatic tachycardia syndrome (POTS): Secondary | ICD-10-CM | POA: Diagnosis not present

## 2023-06-19 DIAGNOSIS — Z79899 Other long term (current) drug therapy: Secondary | ICD-10-CM | POA: Diagnosis not present

## 2023-06-19 LAB — CBC WITH DIFFERENTIAL (CANCER CENTER ONLY)
Abs Immature Granulocytes: 0.01 10*3/uL (ref 0.00–0.07)
Basophils Absolute: 0.1 10*3/uL (ref 0.0–0.1)
Basophils Relative: 1 %
Eosinophils Absolute: 0.1 10*3/uL (ref 0.0–0.5)
Eosinophils Relative: 1 %
HCT: 42.7 % (ref 36.0–46.0)
Hemoglobin: 15.4 g/dL — ABNORMAL HIGH (ref 12.0–15.0)
Immature Granulocytes: 0 %
Lymphocytes Relative: 35 %
Lymphs Abs: 1.8 10*3/uL (ref 0.7–4.0)
MCH: 32.3 pg (ref 26.0–34.0)
MCHC: 36.1 g/dL — ABNORMAL HIGH (ref 30.0–36.0)
MCV: 89.5 fL (ref 80.0–100.0)
Monocytes Absolute: 0.4 10*3/uL (ref 0.1–1.0)
Monocytes Relative: 8 %
Neutro Abs: 2.9 10*3/uL (ref 1.7–7.7)
Neutrophils Relative %: 55 %
Platelet Count: 274 10*3/uL (ref 150–400)
RBC: 4.77 MIL/uL (ref 3.87–5.11)
RDW: 11.9 % (ref 11.5–15.5)
WBC Count: 5.2 10*3/uL (ref 4.0–10.5)
nRBC: 0 % (ref 0.0–0.2)

## 2023-06-19 LAB — CMP (CANCER CENTER ONLY)
ALT: 10 U/L (ref 0–44)
AST: 12 U/L — ABNORMAL LOW (ref 15–41)
Albumin: 5 g/dL (ref 3.5–5.0)
Alkaline Phosphatase: 68 U/L (ref 38–126)
Anion gap: 4 — ABNORMAL LOW (ref 5–15)
BUN: 10 mg/dL (ref 6–20)
CO2: 30 mmol/L (ref 22–32)
Calcium: 9.9 mg/dL (ref 8.9–10.3)
Chloride: 105 mmol/L (ref 98–111)
Creatinine: 0.67 mg/dL (ref 0.44–1.00)
GFR, Estimated: >60 mL/min (ref >60)
Glucose, Bld: 99 mg/dL (ref 70–99)
Potassium: 4.2 mmol/L (ref 3.5–5.1)
Sodium: 139 mmol/L (ref 135–145)
Total Bilirubin: 0.6 mg/dL (ref 0.0–1.2)
Total Protein: 7.3 g/dL (ref 6.5–8.1)

## 2023-06-19 LAB — VITAMIN B12: Vitamin B-12: 789 pg/mL (ref 180–914)

## 2023-06-19 NOTE — Progress Notes (Signed)
 [facility] [phone]   [phone]  INITIAL CONSULT NOTE  Patient Care Team: Alejandra Amos, MD as PCP - General (Pediatrics) Nahser, Lela Purple, MD as PCP - Cardiology (Cardiology)  Hematological/Oncological History # Elevated Urine Porphyrins 05/13/2023: Patient had urine for urines drawn which showed mild elevations in the copropophyrin I and III. 06/19/2023: Establish care with Dr. Rosaline Coma.  CHIEF COMPLAINTS/PURPOSE OF CONSULTATION:  "Elevated Urine Porphyrins "  HISTORY OF PRESENTING ILLNESS:  Tanya Cunningham 18 y.o. female with medical history significant for POTS who presents for evaluation of elevated urine porphyrins.  On review of the previous records the patient had urine collected on 05/13/2023 which showed mild elevations in the copropophyrin I and III.  Due to concern for these findings the patient was referred to hematology for further evaluation and management.  On exam today Tanya Cunningham reports that she is been having trouble with her muscles since November.  She reports that she had a monoinfection in August 2024 and subsequently November developed burning in her lower extremities.  She reports is also occurring some by her upper extremities but she is not having any abdominal pain or nausea, vomiting, or diarrhea.  She reports that it does tend to be worse at night.  She does have a history of dysautonomia currently followed by cardiologist.  But she is not currently having any lightheadedness, dizziness, or shortness of breath.  She reports that her appetite has been good.  She has noticed no issues with her urine such as change in the color she had notes that she does eat a special diet with limited sugar and rare dairy/gluten.  On further discussion she reports her mom and dad are healthy.  Her maternal grandfather has hemophilia.  She has 2 healthy siblings.  She is a never smoker never drinker and is currently studying health  sciences at [employer].  Otherwise she currently denies any fevers, chills, sweats.  A full 10 point ROS is otherwise negative.  MEDICAL HISTORY:  Past Medical History:  Diagnosis Date   POTS (postural orthostatic tachycardia syndrome)     SURGICAL HISTORY: No past surgical history on file.  SOCIAL HISTORY: Social History   Socioeconomic History   Marital status: Single    Spouse name: Not on file   Number of children: Not on file   Years of education: Not on file   Highest education level: Not on file  Occupational History   Not on file  Tobacco Use   Smoking status: Never   Smokeless tobacco: Never  Substance and Sexual Activity   Alcohol use: No    Alcohol/week: 0.0 standard drinks of alcohol   Drug use: No   Sexual activity: Not on file  Other Topics Concern   Not on file  Social History Narrative   Not on file   Social Drivers of Health   Financial Resource Strain: Not on file  Food Insecurity: Not on file  Transportation Needs: Not on file  Physical Activity: Not on file  Stress: Not on file  Social Connections: Unknown (07/11/2021)   Received from [facility], [facility]   Social Network    Social Network: Not on file  Intimate Partner Violence: Unknown (06/02/2021)   Received from [facility], [facility]   HITS    Physically Hurt: Not on file    Insult or Talk Down To: Not on file    Threaten Physical Harm: Not on file    Scream  or Curse: Not on file    FAMILY HISTORY: No family history on file.  ALLERGIES:  is allergic to other.  MEDICATIONS:  No current outpatient medications on file.   No current facility-administered medications for this visit.    REVIEW OF SYSTEMS:   Constitutional: ( - ) fevers, ( - )  chills , ( - ) night sweats Eyes: ( - ) blurriness of vision, ( - ) double vision, ( - ) watery eyes Ears, nose, mouth, throat, and face: ( - ) mucositis, ( - ) sore throat Respiratory: ( - ) cough, ( - ) dyspnea, ( - )  wheezes Cardiovascular: ( - ) palpitation, ( - ) chest discomfort, ( - ) lower extremity swelling Gastrointestinal:  ( - ) nausea, ( - ) heartburn, ( - ) change in bowel habits Skin: ( - ) abnormal skin rashes Lymphatics: ( - ) new lymphadenopathy, ( - ) easy bruising Neurological: ( - ) numbness, ( - ) tingling, ( - ) new weaknesses Behavioral/Psych: ( - ) mood change, ( - ) new changes  All other systems were reviewed with the patient and are negative.  PHYSICAL EXAMINATION:  Vitals:   06/19/23 0918  BP: (!) 88/78  Pulse: 83  Resp: 15  Temp: 97.7 F (36.5 C)  SpO2: 100%   Filed Weights   06/19/23 0918  Weight: 116 lb (52.6 kg)    GENERAL: well appearing young Caucasian female in NAD  SKIN: skin color, texture, turgor are normal, no rashes or significant lesions EYES: conjunctiva are pink and non-injected, sclera clear LUNGS: clear to auscultation and percussion with normal breathing effort HEART: regular rate & rhythm and no murmurs and no lower extremity edema Musculoskeletal: no cyanosis of digits and no clubbing  PSYCH: alert & oriented x 3, fluent speech NEURO: no focal motor/sensory deficits  LABORATORY DATA:  I have reviewed the data as listed    Latest Ref Rng & Units 06/19/2023    9:53 AM 05/13/2023    2:50 PM 01/10/2023    4:15 PM  CBC  WBC 4.0 - 10.5 K/uL 5.2  6.6  7.1   Hemoglobin 12.0 - 15.0 g/dL 16.1  09.6  04.5   Hematocrit 36.0 - 46.0 % 42.7  43.1  44.3   Platelets 150 - 400 K/uL 274  280.0  256        Latest Ref Rng & Units 06/19/2023    9:53 AM 05/13/2023    2:50 PM 01/10/2023    4:15 PM  CMP  Glucose 70 - 99 mg/dL 99  409  98   BUN 6 - 20 mg/dL 10  12  14    Creatinine 0.44 - 1.00 mg/dL 8.11  9.14  7.82   Sodium 135 - 145 mmol/L 139  138  139   Potassium 3.5 - 5.1 mmol/L 4.2  3.6  3.9   Chloride 98 - 111 mmol/L 105  103  103   CO2 22 - 32 mmol/L 30  27  18    Calcium 8.9 - 10.3 mg/dL 9.9  9.3    9.4  9.6   Total Protein 6.5 - 8.1 g/dL 7.3   7.3  7.0   Total Bilirubin 0.0 - 1.2 mg/dL 0.6  0.4  0.5   Alkaline Phos 38 - 126 U/L 68  74  78   AST 15 - 41 U/L 12  15  16    ALT 0 - 44 U/L 10  17  13  ASSESSMENT & PLAN Tanya Cunningham 18 y.o. female with medical history significant for POTS who presents for evaluation of elevated urine porphyrins.  After review of the labs, review of the records, and discussion with the patient the patients findings are most consistent with muscle aches not consistent with porphyria.  Classical symptoms like abdominal pain, nausea, vomiting are absent in this patient.  There are mild elevations in the urine for appearance, however these are not consistent with acute intermittent porphyria.  Will order additional testing with urine PBG's and repeat for porphyrins.  # Elevated Urine Porphyrins -- Will repeat the urine porphyrins testing as well as order a urine PBG.  These are spot testing not 24-hour collections send out to [facility].  -- Will order CBC and CMP for baseline today. -- At this time very low clinical suspicion for porphyria.  Additionally the lab report on 05/13/2023 noted that the porphyria were minimally elevated in a pattern not consistent with porphyria. --Return to clinic pending results of the above studies.   Orders Placed This Encounter  Procedures   Miscellaneous [facility] test (send-out)    Standing Status:   Future    Number of Occurrences:   1    Expected Date:   06/19/2023    Expiration Date:   06/18/2024    Test name / description::   Test 409811. Porphobilinogen (PBG) Quant random urine    Release to patient:   Immediate   Miscellaneous [facility] test (send-out)    Standing Status:   Future    Number of Occurrences:   1    Expected Date:   06/19/2023    Expiration Date:   06/18/2024    Test name / description::   Test 914782. Porphyrins. Quant. Random urine.   CBC with Differential (Cancer Center Only)    Standing Status:   Future    Number of Occurrences:   1     Expiration Date:   06/18/2024   CMP (Cancer Center only)    Standing Status:   Future    Number of Occurrences:   1    Expiration Date:   06/18/2024   Vitamin B12    Standing Status:   Future    Number of Occurrences:   1    Expiration Date:   06/18/2024   Methylmalonic acid, serum    Standing Status:   Future    Number of Occurrences:   1    Expiration Date:   06/18/2024    All questions were answered. The patient knows to call the clinic with any problems, questions or concerns.  A total of more than 45 minutes were spent on this encounter with face-to-face time and non-face-to-face time, including preparing to see the patient, ordering tests and/or medications, counseling the patient and coordination of care as outlined above.   Rogerio Clay, MD Department of Hematology/Oncology [facility] Phone: [phone] Pager: [phone] Email: [email or web address]  06/19/2023 11:24 AM

## 2023-06-21 LAB — MISC LABCORP TEST (SEND OUT): Labcorp test code: 3065

## 2023-06-23 LAB — METHYLMALONIC ACID, SERUM: Methylmalonic Acid, Quantitative: 90 nmol/L (ref 0–378)

## 2023-06-24 ENCOUNTER — Encounter: Payer: Self-pay | Admitting: Family Medicine

## 2023-06-24 ENCOUNTER — Telehealth: Payer: Self-pay | Admitting: *Deleted

## 2023-06-24 LAB — MISC LABCORP TEST (SEND OUT): Labcorp test code: 120980

## 2023-06-24 NOTE — Telephone Encounter (Signed)
-----   Message from Rogerio Clay IV sent at 06/24/2023  3:00 PM EDT ----- Please let Tanya Cunningham know that her porphyria testing has returned and is not consistent with porphyria.  I do not suspect she has this condition.  Recommend continued workup with her other specialist.  There is no need for routine follow-up in our clinic unless she would have new or worsening hematological issues. ----- Message ----- From: Dannis Dy, [facility] [place] Sent: 06/19/2023  10:21 AM EDT To: Ander Bame, MD

## 2023-06-24 NOTE — Telephone Encounter (Signed)
 TCT patient regarding recent lab results.  Spoke with her. Advised that her porphyria testing has returned and is not consistent with porphyria. I do not suspect she has this condition. Recommend continued workup with her other specialist. There is no need for routine follow-up in our clinic unless she would have new or worsening hematological issues. Pt voiced understanding.

## 2023-07-02 ENCOUNTER — Ambulatory Visit: Payer: BC Managed Care – PPO | Admitting: Internal Medicine

## 2023-07-04 NOTE — Progress Notes (Signed)
 [facility] [address] [place] [address] Phone: [phone] Subjective:   IBryan Caprio, am serving as a scribe for Dr. Ronnell Coins.  I'm seeing this patient by the request  of:  Alejandra Amos, MD  CC: left foot pain follow up   [id number]  05/09/2023 Significant polyarthralgia noted.  Discussed icing regimen and home exercises, discussed which activities to do and which ones to avoid.  Increase activity slowly.  Do feel at this point with patient having pain for such a great amount of time we should consider the further evaluation.  Increase activity slowly otherwise.  Follow-up with me again in 6 to 8 weeks.  Would like to see what the laboratory workup which showed.  I do think we do also need to rule out such things as demyelinating diseases and we will get an MRI of the head and neck to further evaluate for anything like that.  Rule out autoimmune with labs.  Will follow-up after workup.      Update 07/09/2023 Tanya Cunningham is a 18 y.o. female coming in with complaint of L foot pain. Patient states doing about the same. Maybe a little better. Pain has decreased in intensity and frequency. Hormones tested, high testosterone .   Past Medical History:  Diagnosis Date   POTS (postural orthostatic tachycardia syndrome)    No past surgical history on file. Social History   Socioeconomic History   Marital status: Single    Spouse name: Not on file   Number of children: Not on file   Years of education: Not on file   Highest education level: Not on file  Occupational History   Not on file  Tobacco Use   Smoking status: Never   Smokeless tobacco: Never  Substance and Sexual Activity   Alcohol use: No    Alcohol/week: 0.0 standard drinks of alcohol   Drug use: No   Sexual activity: Not on file  Other Topics Concern   Not on file  Social History Narrative   Not on file   Social Drivers of Health   Financial Resource  Strain: Not on file  Food Insecurity: Not on file  Transportation Needs: Not on file  Physical Activity: Not on file  Stress: Not on file  Social Connections: Unknown (07/11/2021)   Received from [facility]   Social Network    Social Network: Not on file   Allergies  Allergen Reactions   Other Rash    Honey and crystal lite drink     Reviewed prior external information including notes and imaging from  primary care provider As well as notes that were available from care everywhere and other healthcare systems.  Past medical history, social, surgical and family history all reviewed in electronic medical record.  No pertanent information unless stated regarding to the chief complaint.   Review of Systems:  No headache, visual changes, nausea, vomiting, diarrhea, constipation, dizziness, abdominal pain, skin rash, fevers, chills, night sweats, weight loss, swollen lymph nodes, body aches, joint swelling, chest pain, shortness of breath, mood changes. POSITIVE muscle aches but improving.  Objective  There were no vitals taken for this visit.   General: No apparent distress alert and oriented x3 mood and affect normal, dressed appropriately.  HEENT: Pupils equal, extraocular movements intact  Respiratory: Patient's speak in full sentences and does not appear short of breath  Cardiovascular: No lower extremity edema, non tender, no erythema  Left foot exam  shows great range of motion and nontender.  Sitting comfortably on the exam table    Impression and Recommendations:    The above documentation has been reviewed and is accurate and complete Jamicheal Heard M Nashae Maudlin, DO

## 2023-07-09 ENCOUNTER — Other Ambulatory Visit: Payer: Self-pay

## 2023-07-09 ENCOUNTER — Ambulatory Visit (INDEPENDENT_AMBULATORY_CARE_PROVIDER_SITE_OTHER): Admitting: Family Medicine

## 2023-07-09 VITALS — BP 110/68 | Ht 66.0 in | Wt 115.0 lb

## 2023-07-09 DIAGNOSIS — M2550 Pain in unspecified joint: Secondary | ICD-10-CM

## 2023-07-09 DIAGNOSIS — M79672 Pain in left foot: Secondary | ICD-10-CM

## 2023-07-09 NOTE — Patient Instructions (Signed)
 Good to see you! Labs today See you again in 3 months if needed

## 2023-07-09 NOTE — Assessment & Plan Note (Addendum)
 Patient is doing much better.  He thinks that there is still some things that could be a little better for her.  Do think that all hormones are playing a role in her would like to recheck.  Did have elevated testosterone  previously.  Foot exam is improving.  Would like to recheck the testosterone  and make sure it is coming down.  Hopefully patient's leg better and better and will get back to her regular baseline soon.  Follow-up again in 3 months reviewing patient's imaging was found to have a fatty liver so we will get lipid panel to further evaluate as well.

## 2023-08-09 ENCOUNTER — Ambulatory Visit: Admitting: Internal Medicine

## 2023-09-25 ENCOUNTER — Encounter: Payer: Self-pay | Admitting: Family Medicine

## 2023-10-15 ENCOUNTER — Ambulatory Visit: Admitting: Family Medicine

## 2023-11-04 ENCOUNTER — Ambulatory Visit: Admitting: Family Medicine

## 2023-11-25 NOTE — Progress Notes (Deleted)
     HPI: Follow-up dysautonomia/vasovagal syncope.  Previously followed by Dr. Fernande but transitioning to me.  Brain MRI March 2025 normal.  Echocardiogram February 2023 at [facility] normal.  Cardiac MRI April 2025 showed normal LV function, normal RV function and no evidence of prior myocardial infarction, infiltrative disease or myocarditis.  Since last seen  No current outpatient medications on file.   No current facility-administered medications for this visit.     Past Medical History:  Diagnosis Date   POTS (postural orthostatic tachycardia syndrome)     No past surgical history on file.  Social History   Socioeconomic History   Marital status: Single    Spouse name: Not on file   Number of children: Not on file   Years of education: Not on file   Highest education level: Not on file  Occupational History   Not on file  Tobacco Use   Smoking status: Never   Smokeless tobacco: Never  Substance and Sexual Activity   Alcohol use: No    Alcohol/week: 0.0 standard drinks of alcohol   Drug use: No   Sexual activity: Not on file  Other Topics Concern   Not on file  Social History Narrative   Not on file   Social Drivers of Health   Financial Resource Strain: Not on file  Food Insecurity: Not on file  Transportation Needs: Not on file  Physical Activity: Not on file  Stress: Not on file  Social Connections: Unknown (07/11/2021)   Received from [facility]   Social Network    Social Network: Not on file  Intimate Partner Violence: Unknown (06/02/2021)   Received from [facility]   HITS    Physically Hurt: Not on file    Insult or Talk Down To: Not on file    Threaten Physical Harm: Not on file    Scream or Curse: Not on file    No family history on file.  ROS: no fevers or chills, productive cough, hemoptysis, dysphasia, odynophagia, melena, hematochezia, dysuria, hematuria, rash, seizure activity, orthopnea, PND, pedal edema, claudication. Remaining systems  are negative.  Physical Exam: Well-developed well-nourished in no acute distress.  Skin is warm and dry.  HEENT is normal.  Neck is supple.  Chest is clear to auscultation with normal expansion.  Cardiovascular exam is regular rate and rhythm.  Abdominal exam nontender or distended. No masses palpated. Extremities show no edema. neuro grossly intact  ECG- personally reviewed  A/P  1 dysautonomia-  Redell Shallow, MD

## 2023-11-28 ENCOUNTER — Other Ambulatory Visit: Payer: Self-pay

## 2023-11-28 ENCOUNTER — Ambulatory Visit: Admitting: Cardiology

## 2023-11-28 ENCOUNTER — Ambulatory Visit
Admission: RE | Admit: 2023-11-28 | Discharge: 2023-11-28 | Disposition: A | Discharge status: Home / Self Care | Attending: Family Medicine | Admitting: Family Medicine

## 2023-11-28 VITALS — BP 119/80 | HR 102 | Temp 98.2°F | Resp 16

## 2023-11-28 DIAGNOSIS — J019 Acute sinusitis, unspecified: Secondary | ICD-10-CM

## 2023-11-28 DIAGNOSIS — J111 Influenza due to unidentified influenza virus with other respiratory manifestations: Secondary | ICD-10-CM | POA: Diagnosis not present

## 2023-11-28 HISTORY — DX: Palpitations: R00.2

## 2023-11-28 HISTORY — DX: Cyst of kidney, acquired: N28.1

## 2023-11-28 HISTORY — DX: Pain in unspecified joint: M25.50

## 2023-11-28 HISTORY — DX: Infectious mononucleosis, unspecified without complication: B27.90

## 2023-11-28 LAB — POC SOFIA SARS ANTIGEN FIA: SARS Coronavirus 2 Ag: NEGATIVE

## 2023-11-28 MED ORDER — AMOXICILLIN 875 MG PO TABS: 875.0000 mg | ORAL_TABLET | Freq: Two times a day (BID) | ORAL | 0 refills | Status: AC

## 2023-11-28 NOTE — ED Provider Notes (Signed)
 [name]    CSN: [id number] Arrival date & time: 11/28/23  1152      History   Chief Complaint Chief Complaint  Patient presents with   Cough    Entered by patient   Fever   Sore Throat    HPI Tanya Cunningham is a 18 y.o. female.   HPI  Interesting history.  Child is here with her mother.  Apparently she had mono couple years ago.  She was sick 3 weeks ago and went to a clinic where she was diagnosed as having a flare of her mono.  She was sick with coughing sneezing and fatigue.  She states that this has never gone away and now she is feeling feverish, having increased sputum production, and continued to feel tired.  Current symptoms have been going on for over 3 weeks.  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 No pertinent surgical history.  OB History   No obstetric history on file.      Home Medications    Prior to Admission medications   Medication Sig Start Date End Date Taking? Authorizing Provider  amoxicillin (AMOXIL) 875 MG tablet Take 1 tablet (875 mg total) by mouth 2 (two) times daily. 11/28/23  Yes Maranda Jamee Jacob, MD    Family History History reviewed. No pertinent family history.  Social History Social History   Tobacco Use   Smoking status: Never   Smokeless tobacco: Never  Substance Use Topics   Alcohol use: No    Alcohol/week: 0.0 standard drinks of  alcohol   Drug use: No     Allergies   Other   Review of Systems Review of Systems See HPI  Physical Exam Triage Vital Signs ED Triage Vitals  Encounter Vitals Group     BP 11/28/23 1159 119/80     Girls Systolic BP Percentile --      Girls Diastolic BP Percentile --      Boys Systolic BP Percentile --      Boys Diastolic BP Percentile --      Pulse Rate 11/28/23 1159 (!) 102     Resp 11/28/23 1159 16     Temp 11/28/23 1159 98.2 F (36.8 C)     Temp src --      SpO2 11/28/23 1159 96 %     Weight --      Height --      Head Circumference --      Peak Flow --      Pain Score 11/28/23 1203 4     Pain Loc --      Pain Education --      Exclude from Growth Chart --    No data found.  Updated Vital Signs BP 119/80   Pulse (!) 102   Temp 98.2 F (36.8 C)   Resp 16  LMP 11/17/2023 (Exact Date)   SpO2 96%       Physical Exam Constitutional:      General: She is not in acute distress.    Appearance: She is well-developed.  HENT:     Head: Normocephalic and atraumatic.     Right Ear: Tympanic membrane normal.     Left Ear: Tympanic membrane normal.     Nose: Congestion and rhinorrhea present.     Mouth/Throat:     Pharynx: Posterior oropharyngeal erythema present.     Tonsils: No tonsillar exudate.  Eyes:     Conjunctiva/sclera: Conjunctivae normal.     Pupils: Pupils are equal, round, and reactive to light.  Cardiovascular:     Rate and Rhythm: Normal rate and regular rhythm.  Pulmonary:     Effort: Pulmonary effort is normal. No respiratory distress.  Abdominal:     General: There is no distension.     Palpations: Abdomen is soft.  Musculoskeletal:        General: Normal range of motion.     Cervical back: Normal range of motion.  Lymphadenopathy:     Cervical: Cervical adenopathy present.  Skin:    General: Skin is warm and dry.  Neurological:     Mental Status: She is alert.      UC Treatments / Results  Labs (all labs ordered are  listed, but only abnormal results are displayed) Labs Reviewed  POC SOFIA SARS ANTIGEN FIA    EKG   Radiology No results found.  Procedures Procedures (including critical care time)  Medications Ordered in UC Medications - No data to display  Initial Impression / Assessment and Plan / UC Course  I have reviewed the triage vital signs and the nursing notes.  Pertinent labs & imaging results that were available during my care of the patient were reviewed by me and considered in my medical decision making (see chart for details).     Physical exam is noncontributory, mild erythema posterior pharynx.  Symptoms for 3 weeks and worrisome especially with worsening symptoms and new onset of fever. Final Clinical Impressions(s) / UC Diagnoses   Final diagnoses:  Influenza-like illness  Acute sinusitis with symptoms greater than 10 days     Discharge Instructions      Drink lots of fluids Take amoxicillin 2 times a day May use over-the-counter cough or cold medicines See your doctor if not improving by next week   ED Prescriptions     Medication Sig Dispense Auth. Provider   amoxicillin (AMOXIL) 875 MG tablet Take 1 tablet (875 mg total) by mouth 2 (two) times daily. 14 tablet Maranda Jamee Jacob, MD      PDMP not reviewed this encounter.   Maranda Jamee Jacob, MD 11/28/23 1332

## 2023-11-28 NOTE — ED Triage Notes (Addendum)
 3 weeks ago tested positive for mono. Already had cough, sneezing, fatigue. Tuesday started feeling more sick; started feeling like she had a fever, cough, ha, sore throat. Did not check temperature but felt feverish at night. Has had benadryl.

## 2023-11-28 NOTE — Discharge Instructions (Signed)
 Drink lots of fluids Take amoxicillin 2 times a day May use over-the-counter cough or cold medicines See your doctor if not improving by next week

## 2023-11-29 ENCOUNTER — Telehealth: Payer: Self-pay | Admitting: Emergency Medicine

## 2023-11-29 NOTE — Telephone Encounter (Signed)
 Spoke w/patient states that she is doing well.  Will follow up as needed.

## 2023-12-12 NOTE — Progress Notes (Signed)
 [facility] [address] [place] [address] Phone: [phone] Subjective:   ISusannah Gully, am serving as a scribe for Dr. Arthea Claudene.  I'm seeing this patient by the request  of:  Tressa Nat PARAS, MD  CC: Multiple joint complaint follow-up  YEP:Dlagzrupcz  07/09/2023 Patient is doing much better.  He thinks that there is still some things that could be a little better for her.  Do think that all hormones are playing a role in her would like to recheck.  Did have elevated testosterone  previously.  Foot exam is improving.  Would like to recheck the testosterone  and make sure it is coming down.  Hopefully patient's leg better and better and will get back to her regular baseline soon.  Follow-up again in 3 months reviewing patient's imaging was found to have a fatty liver so we will get lipid panel to further evaluate as well.     Updated 12/16/2023 Karin Alexy Bringle is a 18 y.o. female coming in with complaint of polyarthralgia. Overall doing better.  Feels like she is doing much better.  Only taking a multivitamin and magnesium.  Very happy at the success at the moment.  Been slow but is making progress.       Past Medical History:  Diagnosis Date   Cyst of right kidney    Mononucleosis    Palpitations    Polyarthralgia    POTS (postural orthostatic tachycardia syndrome)    No past surgical history on file. Social History   Socioeconomic History   Marital status: Single    Spouse name: Not on file   Number of children: Not on file   Years of education: Not on file   Highest education level: Not on file  Occupational History   Not on file  Tobacco Use   Smoking status: Never   Smokeless tobacco: Never  Substance and Sexual Activity   Alcohol use: No    Alcohol/week: 0.0 standard drinks of alcohol   Drug use: No   Sexual activity: Not on file  Other Topics Concern   Not on file  Social History Narrative   Not on file    Social Drivers of Health   Financial Resource Strain: Not on file  Food Insecurity: Not on file  Transportation Needs: Not on file  Physical Activity: Not on file  Stress: Not on file  Social Connections: Unknown (07/11/2021)   Received from [facility]   Social Network    Social Network: Not on file   Allergies  Allergen Reactions   Other Rash    Honey and crystal lite drink   No family history on file.       Current Outpatient Medications (Other):    amoxicillin (AMOXIL) 875 MG tablet, Take 1 tablet (875 mg total) by mouth 2 (two) times daily.   Reviewed prior external information including notes and imaging from  primary care provider As well as notes that were available from care everywhere and other healthcare systems.  Past medical history, social, surgical and family history all reviewed in electronic medical record.  No pertanent information unless stated regarding to the chief complaint.   Review of Systems:  No headache, visual changes, nausea, vomiting, diarrhea, constipation, dizziness, abdominal pain, skin rash, fevers, chills, night sweats, weight loss, swollen lymph nodes, body aches, joint swelling, chest pain, shortness of breath, mood changes. POSITIVE muscle aches  Objective  Blood pressure 102/62, pulse 66, resp. rate (!) 99,  height 5' 6 (1.676 m), weight 119 lb (54 kg), last menstrual period 11/17/2023.   General: No apparent distress alert and oriented x3 mood and affect normal, dressed appropriately.  HEENT: Pupils equal, extraocular movements intact  Respiratory: Patient's speak in full sentences and does not appear short of breath  Cardiovascular: No lower extremity edema, non tender, no erythema  Looks like an extremely healthy individual today.    Impression and Recommendations:     The above documentation has been reviewed and is accurate and complete Ajahni Nay M Rayquon Uselman, DO

## 2023-12-16 ENCOUNTER — Ambulatory Visit (INDEPENDENT_AMBULATORY_CARE_PROVIDER_SITE_OTHER): Admitting: Family Medicine

## 2023-12-16 ENCOUNTER — Encounter: Payer: Self-pay | Admitting: Family Medicine

## 2023-12-16 VITALS — BP 102/62 | HR 66 | Resp 99 | Ht 66.0 in | Wt 119.0 lb

## 2023-12-16 DIAGNOSIS — M2550 Pain in unspecified joint: Secondary | ICD-10-CM | POA: Diagnosis not present

## 2023-12-16 NOTE — Assessment & Plan Note (Signed)
 So much better at this time, recently did have a reactivation of the mono but seems to be doing very well.  I think patient is going continue to improve.  Follow-up with me again as needed

## 2024-01-02 ENCOUNTER — Encounter: Admitting: Family Medicine

## 2024-01-22 ENCOUNTER — Encounter: Payer: Self-pay | Admitting: Cardiology

## 2024-01-22 NOTE — Progress Notes (Signed)
     HPI: Follow-up palpitations and syncope.  Previously seen by Dr. Fernande but now transitioning to me.  Previously seen at [facility] for dysautonomia.  Echocardiogram at [facility] February 2023 normal.  Brain MRI March 2025 normal.  Cardiac MRI April 2025 showed ejection fraction 57% and no myocardial LGE.  Dr. Fernande previously felt her symptoms were not consistent with POTS.  Previously recommended increased sodium and water intake.  Also discussed importance of exercise as needed compression.  Last seen patient denies dyspnea, chest pain or syncope.  Occasional brief palpitations not sustained.  Mild dizziness at times with standing if she does not hydrate.  Current Outpatient Medications  Medication Sig Dispense Refill   amoxicillin  (AMOXIL ) 875 MG tablet Take 1 tablet (875 mg total) by mouth 2 (two) times daily. 14 tablet 0   No current facility-administered medications for this visit.     Past Medical History:  Diagnosis Date   Cyst of right kidney    Mononucleosis    Palpitations    Polyarthralgia     History reviewed. No pertinent surgical history.  Social History   Socioeconomic History   Marital status: Single    Spouse name: Not on file   Number of children: Not on file   Years of education: Not on file   Highest education level: Not on file  Occupational History   Not on file  Tobacco Use   Smoking status: Never   Smokeless tobacco: Never  Substance and Sexual Activity   Alcohol use: No    Alcohol/week: 0.0 standard drinks of alcohol   Drug use: No   Sexual activity: Not on file  Other Topics Concern   Not on file  Social History Narrative   Not on file   Social Drivers of Health   Financial Resource Strain: Not on file  Food Insecurity: Not on file  Transportation Needs: Not on file  Physical Activity: Not on file  Stress: Not on file  Social Connections: Not on file  Intimate Partner Violence: Not on file    History reviewed. No pertinent family  history.  ROS: no fevers or chills, productive cough, hemoptysis, dysphasia, odynophagia, melena, hematochezia, dysuria, hematuria, rash, seizure activity, orthopnea, PND, pedal edema, claudication. Remaining systems are negative.  Physical Exam: Well-developed well-nourished in no acute distress.  Skin is warm and dry.  HEENT is normal.  Neck is supple.  Chest is clear to auscultation with normal expansion.  Cardiovascular exam is regular rate and rhythm.  Abdominal exam nontender or distended. No masses palpated. Extremities show no edema. neuro grossly intact  EKG Interpretation Date/Time:  Tuesday February 04 2024 08:20:12 EST Ventricular Rate:  92 PR Interval:  156 QRS Duration:  90 QT Interval:  350 QTC Calculation: 432 R Axis:   85  Text Interpretation: Sinus rhythm with marked sinus arrhythmia Non-specific ST-t changes Confirmed by Pietro Rogue ([id number]) on 02/04/2024 8:31:30 AM    A/P  1 history of palpitations-symptoms are reasonable at present.  We discussed a smart watch to record any rhythm strips associated with symptoms in the future.  2.  History of dysautonomia-we discussed the importance of maintaining hydration and increase sodium intake as well as exercise.  Rogue Pietro, MD

## 2024-02-04 ENCOUNTER — Ambulatory Visit: Attending: Cardiology | Admitting: Cardiology

## 2024-02-04 ENCOUNTER — Encounter: Payer: Self-pay | Admitting: Cardiology

## 2024-02-04 VITALS — BP 92/50 | HR 92 | Ht 66.0 in | Wt 125.0 lb

## 2024-02-04 DIAGNOSIS — R9431 Abnormal electrocardiogram [ECG] [EKG]: Secondary | ICD-10-CM

## 2024-02-04 DIAGNOSIS — R002 Palpitations: Secondary | ICD-10-CM

## 2024-02-04 NOTE — Patient Instructions (Signed)
   Follow-Up: At [facility], you and your health needs are our priority.  As part of our continuing mission to provide you with exceptional heart care, our providers are all part of one team.  This team includes your primary Cardiologist (physician) and Advanced Practice Providers or APPs (Physician Assistants and Nurse Practitioners) who all work together to provide you with the care you need, when you need it.  Your next appointment:   AS NEEDED

## 2024-05-01 IMAGING — CR DG CHEST 2V
2 series · 2 of 2 positions shown · non-contrast
Comparison: None Available.

CLINICAL DATA: Shortness of breath.

EXAM:
CHEST - 2 VIEW

[chest pa]
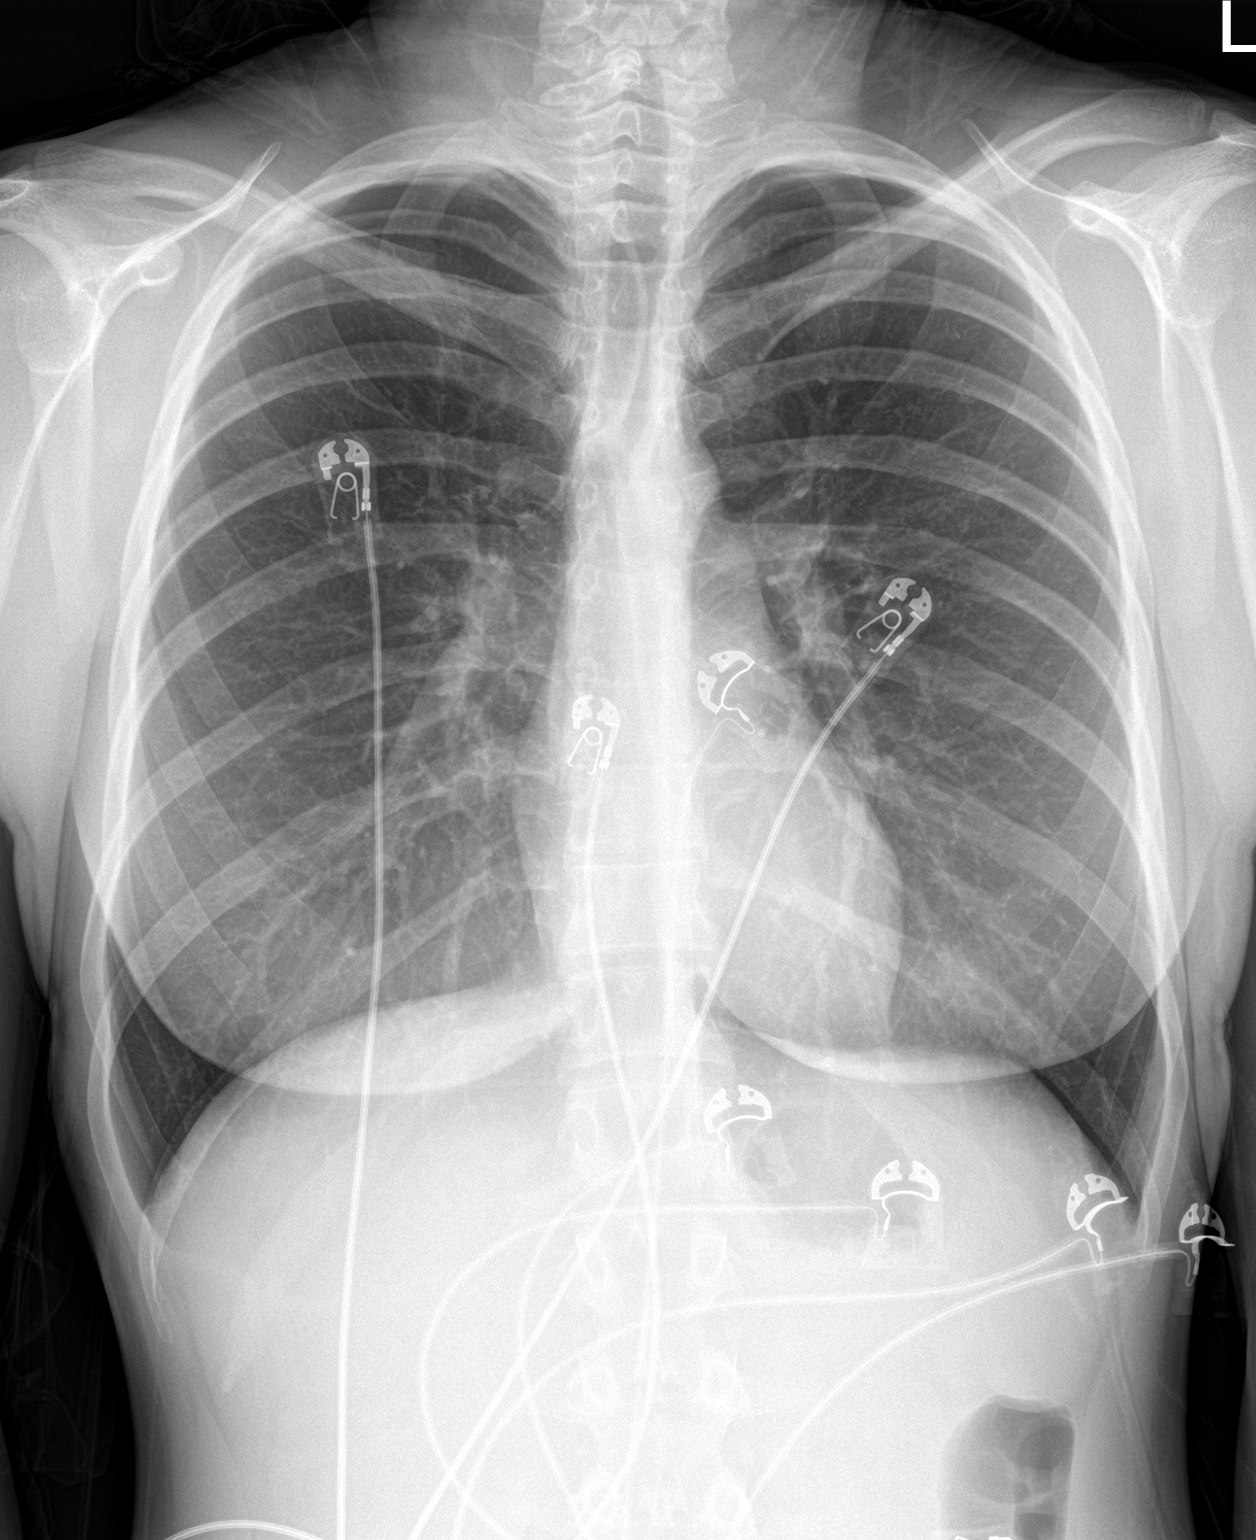

[chest lat]
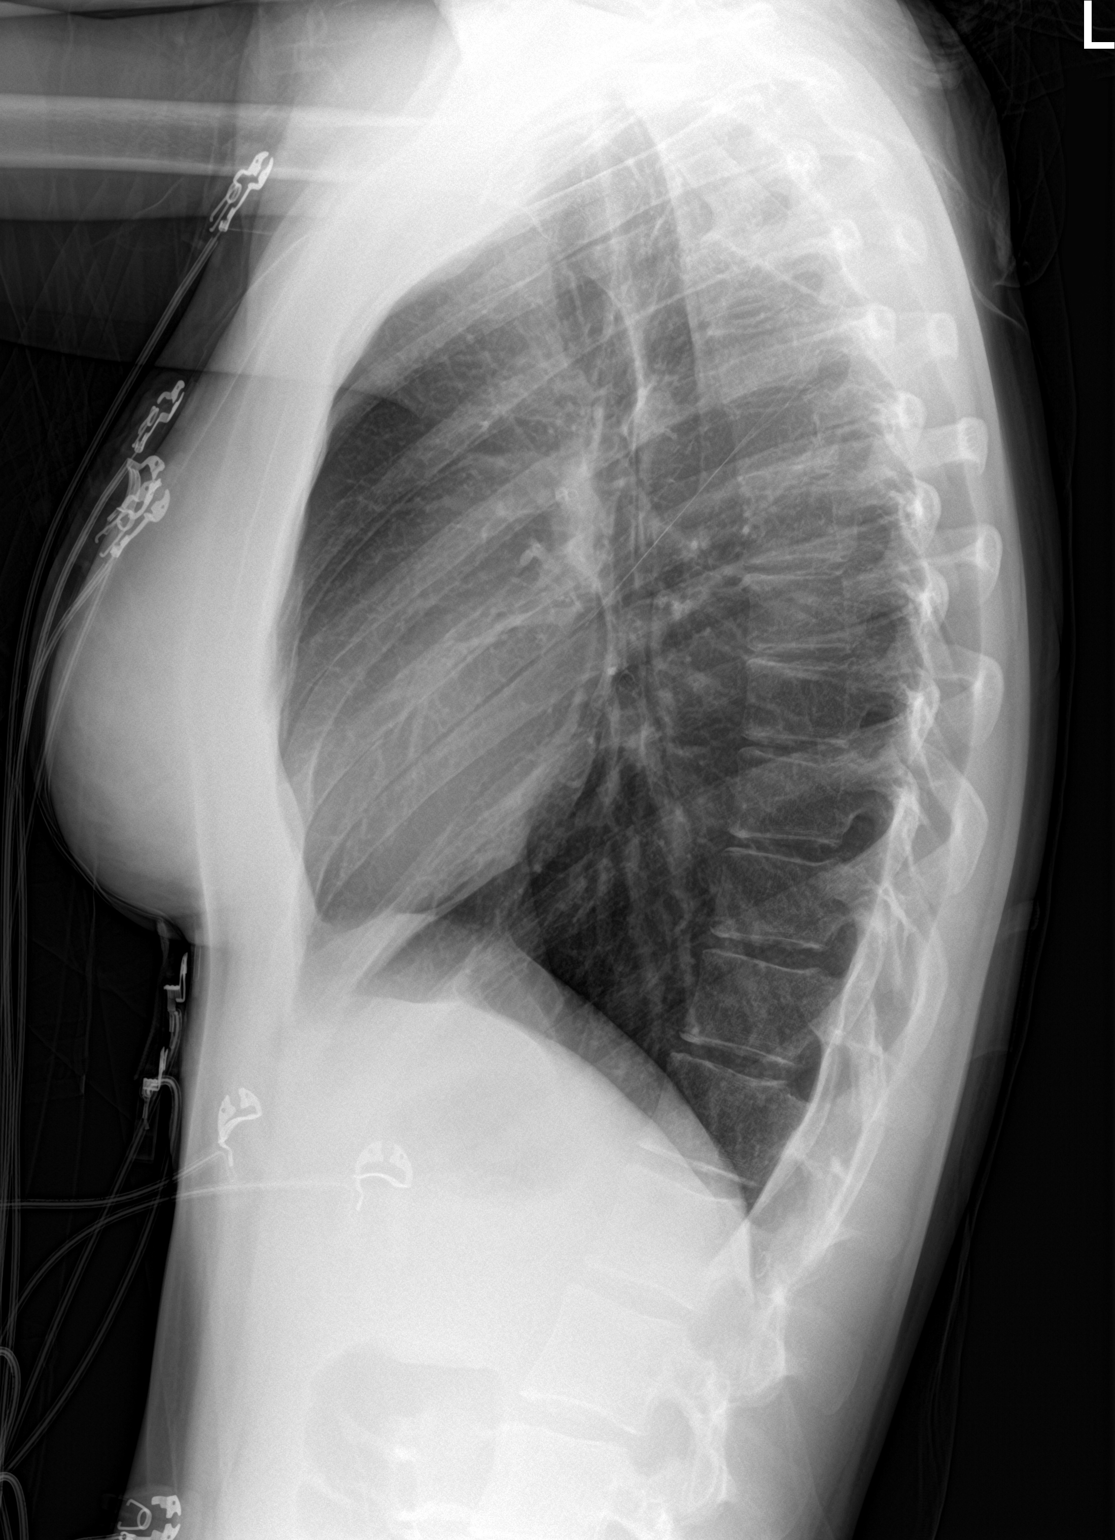

[2 of 2 positions shown; findings below may reference images not displayed]

FINDINGS: The heart size and mediastinal contours are within normal limits.
Both lungs are clear. The visualized skeletal structures are
unremarkable.
IMPRESSION: No active cardiopulmonary disease.

## 2024-10-01 ENCOUNTER — Encounter: Admitting: Family Medicine
